# Patient Record
Sex: Male | Born: 1965 | Race: White | Hispanic: No | Marital: Married | Smoking: Current every day smoker
Health system: Southern US, Community
[De-identification: ages and names within clinical notes are randomized; demographics above are authoritative.]

## PROBLEM LIST (undated history)

## (undated) DIAGNOSIS — G4730 Sleep apnea, unspecified: Secondary | ICD-10-CM

## (undated) DIAGNOSIS — E119 Type 2 diabetes mellitus without complications: Secondary | ICD-10-CM

## (undated) DIAGNOSIS — I639 Cerebral infarction, unspecified: Secondary | ICD-10-CM

## (undated) DIAGNOSIS — N289 Disorder of kidney and ureter, unspecified: Secondary | ICD-10-CM

## (undated) DIAGNOSIS — I10 Essential (primary) hypertension: Secondary | ICD-10-CM

---

## 2017-12-24 ENCOUNTER — Encounter (HOSPITAL_BASED_OUTPATIENT_CLINIC_OR_DEPARTMENT_OTHER): Payer: Self-pay | Admitting: *Deleted

## 2017-12-24 ENCOUNTER — Other Ambulatory Visit: Payer: Self-pay

## 2017-12-24 ENCOUNTER — Emergency Department (HOSPITAL_BASED_OUTPATIENT_CLINIC_OR_DEPARTMENT_OTHER): Payer: Self-pay

## 2017-12-24 ENCOUNTER — Emergency Department (HOSPITAL_BASED_OUTPATIENT_CLINIC_OR_DEPARTMENT_OTHER)
Admission: EM | Admit: 2017-12-24 | Discharge: 2017-12-24 | Disposition: A | Discharge status: Other Acute Inpatient Hospital | Payer: Self-pay | Attending: Emergency Medicine | Admitting: Emergency Medicine

## 2017-12-24 DIAGNOSIS — M6281 Muscle weakness (generalized): Secondary | ICD-10-CM | POA: Insufficient documentation

## 2017-12-24 DIAGNOSIS — Z794 Long term (current) use of insulin: Secondary | ICD-10-CM | POA: Insufficient documentation

## 2017-12-24 DIAGNOSIS — F17200 Nicotine dependence, unspecified, uncomplicated: Secondary | ICD-10-CM | POA: Insufficient documentation

## 2017-12-24 DIAGNOSIS — E119 Type 2 diabetes mellitus without complications: Secondary | ICD-10-CM | POA: Insufficient documentation

## 2017-12-24 DIAGNOSIS — R200 Anesthesia of skin: Secondary | ICD-10-CM | POA: Insufficient documentation

## 2017-12-24 DIAGNOSIS — I10 Essential (primary) hypertension: Secondary | ICD-10-CM | POA: Insufficient documentation

## 2017-12-24 HISTORY — DX: Cerebral infarction, unspecified: I63.9

## 2017-12-24 HISTORY — DX: Type 2 diabetes mellitus without complications: E11.9

## 2017-12-24 HISTORY — DX: Essential (primary) hypertension: I10

## 2017-12-24 HISTORY — DX: Sleep apnea, unspecified: G47.30

## 2017-12-24 HISTORY — DX: Disorder of kidney and ureter, unspecified: N28.9

## 2017-12-24 LAB — I-STAT VENOUS BLOOD GAS, ED
Bicarbonate: 24.9 mmol/L (ref 20.0–28.0)
O2 Saturation: 96 %
Patient temperature: 99.3
TCO2: 26 mmol/L (ref 22–32)
pCO2, Ven: 41.5 mmHg — ABNORMAL LOW (ref 44.0–60.0)
pH, Ven: 7.388 (ref 7.250–7.430)
pO2, Ven: 83 mmHg — ABNORMAL HIGH (ref 32.0–45.0)

## 2017-12-24 LAB — CBC WITH DIFFERENTIAL/PLATELET
Basophils Absolute: 0.1 10*3/uL (ref 0.0–0.1)
Basophils Relative: 1 %
Eosinophils Absolute: 0.2 10*3/uL (ref 0.0–0.7)
Eosinophils Relative: 2 %
HCT: 38.2 % — ABNORMAL LOW (ref 39.0–52.0)
Hemoglobin: 13.1 g/dL (ref 13.0–17.0)
Lymphocytes Relative: 32 %
Lymphs Abs: 2.6 10*3/uL (ref 0.7–4.0)
MCH: 29.4 pg (ref 26.0–34.0)
MCHC: 34.3 g/dL (ref 30.0–36.0)
MCV: 85.7 fL (ref 78.0–100.0)
Monocytes Absolute: 0.4 10*3/uL (ref 0.1–1.0)
Monocytes Relative: 4 %
Neutro Abs: 5 10*3/uL (ref 1.7–7.7)
Neutrophils Relative %: 61 %
Platelets: 360 10*3/uL (ref 150–400)
RBC: 4.46 MIL/uL (ref 4.22–5.81)
RDW: 14.5 % (ref 11.5–15.5)
WBC: 8.2 10*3/uL (ref 4.0–10.5)

## 2017-12-24 LAB — RAPID URINE DRUG SCREEN, HOSP PERFORMED
Amphetamines: NOT DETECTED
Barbiturates: NOT DETECTED
Benzodiazepines: NOT DETECTED
Cocaine: NOT DETECTED
Opiates: NOT DETECTED
Tetrahydrocannabinol: NOT DETECTED

## 2017-12-24 LAB — I-STAT CG4 LACTIC ACID, ED: Lactic Acid, Venous: 2.68 mmol/L (ref 0.5–1.9)

## 2017-12-24 LAB — COMPREHENSIVE METABOLIC PANEL
ALT: 18 U/L (ref 17–63)
AST: 26 U/L (ref 15–41)
Albumin: 3.4 g/dL — ABNORMAL LOW (ref 3.5–5.0)
Alkaline Phosphatase: 60 U/L (ref 38–126)
Anion gap: 11 (ref 5–15)
BUN: 26 mg/dL — ABNORMAL HIGH (ref 6–20)
CO2: 23 mmol/L (ref 22–32)
Calcium: 9 mg/dL (ref 8.9–10.3)
Chloride: 101 mmol/L (ref 101–111)
Creatinine, Ser: 1.84 mg/dL — ABNORMAL HIGH (ref 0.61–1.24)
GFR calc Af Amer: 48 mL/min — ABNORMAL LOW (ref >60)
GFR calc non Af Amer: 41 mL/min — ABNORMAL LOW (ref >60)
Glucose, Bld: 349 mg/dL — ABNORMAL HIGH (ref 65–99)
Potassium: 4 mmol/L (ref 3.5–5.1)
Sodium: 135 mmol/L (ref 135–145)
Total Bilirubin: 0.2 mg/dL — ABNORMAL LOW (ref 0.3–1.2)
Total Protein: 6.6 g/dL (ref 6.5–8.1)

## 2017-12-24 LAB — CBG MONITORING, ED: Glucose-Capillary: 333 mg/dL — ABNORMAL HIGH (ref 65–99)

## 2017-12-24 LAB — URINALYSIS, ROUTINE W REFLEX MICROSCOPIC
Bilirubin Urine: NEGATIVE
Glucose, UA: >=500 mg/dL — AB
Ketones, ur: NEGATIVE mg/dL
Leukocytes, UA: NEGATIVE
Nitrite: NEGATIVE
Protein, ur: >300 mg/dL — AB
Specific Gravity, Urine: >1.03 — ABNORMAL HIGH (ref 1.005–1.030)
pH: 6 (ref 5.0–8.0)

## 2017-12-24 LAB — URINALYSIS, MICROSCOPIC (REFLEX)

## 2017-12-24 MED ORDER — SODIUM CHLORIDE 0.9 % IV BOLUS (SEPSIS)
1000.0000 mL | Freq: Once | INTRAVENOUS | Status: AC
  Administered 2017-12-24: 1000 mL via INTRAVENOUS

## 2017-12-24 MED ORDER — ASPIRIN 81 MG PO CHEW
324.0000 mg | CHEWABLE_TABLET | Freq: Once | ORAL | Status: DC
  Filled 2017-12-24: qty 4

## 2017-12-24 NOTE — ED Notes (Signed)
Pt on monitor 

## 2017-12-24 NOTE — ED Provider Notes (Signed)
[facility] EMERGENCY DEPARTMENT Provider Note   CSN: [id number] Arrival date & time: 12/24/17  1422     History   Chief Complaint Chief Complaint  Patient presents with  . Numbness    HPI Joe Wyatt is a 52 y.o. male.  Patient is a 52 year old male with a history of hypertension, diabetes and prior strokes who presents with left leg weakness.  He states that yesterday morning at about 10:30 AM he noticed that his left leg was weak and he was having to drag it.  He also had some associated numbness.  He did not have any other extremity weakness.  No headache.  No vision changes.  No speech deficits.  He states that it remained weak throughout the day.  He drove home from work and took a nap late yesterday afternoon when he woke up from nap his symptoms had resolved.  He states that his symptoms remained resolved until today at 1:00 PM he noticed a recurrence of the left leg weakness.  It felt similar to yesterday although it was not as severe.  He states his numbness and weakness was still present on arrival to the emergency department although now it is completely resolved.  He denies any arm involvement.  No speech deficits.  No vision changes.  No recent head injuries.  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  He was given TPA and his symptoms resolved.  He is also had a prior stroke resulting in right-sided weakness and he has some mild chronic right-sided weakness related to that.  He denies any other recent illnesses.  No fevers.  No cough or cold symptoms.  No chest pain or shortness of breath.  He has noted that his blood sugars been elevated today.  He felt like his blood sugar was elevated last night and took an increased dose of his insulin.      Past Medical History:  Diagnosis Date  . Diabetes mellitus without complication (HCC)   . Hypertension   . Renal disorder   . Sleep apnea   . Stroke [facility])      There are no active problems to display for this patient.   History reviewed. No pertinent surgical history.     Home Medications    Prior to Admission medications   Not on File    Family History No family history on file.  Social History Social History   Tobacco Use  . Smoking status: Current Every Day Smoker  . Smokeless tobacco: Never Used  Substance Use Topics  . Alcohol use: Yes  . Drug use: No     Allergies   Patient has no known allergies.   Review of Systems Review of Systems  Constitutional: Negative for chills, diaphoresis, fatigue and fever.  HENT: Negative for congestion, rhinorrhea and sneezing.   Eyes: Negative.   Respiratory: Negative for cough, chest tightness and shortness of breath.   Cardiovascular: Negative for chest pain and leg swelling.  Gastrointestinal: Negative for abdominal pain, blood in stool, diarrhea, nausea and vomiting.  Genitourinary: Negative for difficulty urinating, flank pain, frequency and hematuria.  Musculoskeletal: Negative for arthralgias and back pain.  Skin: Negative for rash.  Neurological: Positive for weakness and numbness. Negative for dizziness, speech difficulty and headaches.     Physical Exam Updated Vital Signs BP (!) 169/96   Pulse 87   Temp 98.6 F (37 C)   Resp 18  Ht 5\' 8"  (1.727 m)   Wt 88.5 kg (195 lb)   SpO2 97%   BMI 29.65 kg/m   Physical Exam  Constitutional: He is oriented to person, place, and time. He appears well-developed and well-nourished.  HENT:  Head: Normocephalic and atraumatic.  Eyes: Pupils are equal, round, and reactive to light.  Neck: Normal range of motion. Neck supple.  Cardiovascular: Normal rate, regular rhythm and normal heart sounds.  Pulmonary/Chest: Effort normal and breath sounds normal. No respiratory distress. He has no wheezes. He has no rales. He exhibits no tenderness.  Abdominal: Soft. Bowel sounds are normal. There is no tenderness. There is no  rebound and no guarding.  Musculoskeletal: Normal range of motion. He exhibits no edema.  Lymphadenopathy:    He has no cervical adenopathy.  Neurological: He is alert and oriented to person, place, and time.  Motor 5/5 all extremities Sensation grossly intact to LT all extremities Finger to Nose intact, no pronator drift CN II-XII grossly intact    Skin: Skin is warm and dry. No rash noted.  Psychiatric: He has a normal mood and affect.     ED Treatments / Results  Labs (all labs ordered are listed, but only abnormal results are displayed) Labs Reviewed  COMPREHENSIVE METABOLIC PANEL - Abnormal; Notable for the following components:      Result Value   Glucose, Bld 349 (*)    BUN 26 (*)    Creatinine, Ser 1.84 (*)    Albumin 3.4 (*)    Total Bilirubin 0.2 (*)    GFR calc non Af Amer 41 (*)    GFR calc Af Amer 48 (*)    All other components within normal limits  CBC WITH DIFFERENTIAL/PLATELET - Abnormal; Notable for the following components:   HCT 38.2 (*)    All other components within normal limits  CBG MONITORING, ED - Abnormal; Notable for the following components:   Glucose-Capillary 333 (*)    All other components within normal limits  I-STAT CG4 LACTIC ACID, ED - Abnormal; Notable for the following components:   Lactic Acid, Venous 2.68 (*)    All other components within normal limits  I-STAT VENOUS BLOOD GAS, ED - Abnormal; Notable for the following components:   pCO2, Ven 41.5 (*)    pO2, Ven 83.0 (*)    All other components within normal limits  URINALYSIS, ROUTINE W REFLEX MICROSCOPIC  BASIC METABOLIC PANEL  ETHANOL  PROTIME-INR  APTT  DIFFERENTIAL  TROPONIN I  RAPID URINE DRUG SCREEN, HOSP PERFORMED  I-STAT CG4 LACTIC ACID, ED    EKG  EKG Interpretation  Date/Time:  Monday December 24 2017 15:53:00 EDT Ventricular Rate:  92 PR Interval:    QRS Duration: 103 QT Interval:  368 QTC Calculation: 456 R Axis:   140 Text Interpretation:  Sinus rhythm  Right axis deviation Borderline repolarization abnormality No old tracing to compare Confirmed by Rolan BuccoBelfi, Donovon Micheletti [phone]([id number]) on 12/24/2017 4:30:49 PM       Radiology Ct Head Wo Contrast  Result Date: 12/24/2017 CLINICAL DATA:  Numbness and heaviness of the left leg, intermittent yesterday and today. EXAM: CT HEAD WITHOUT CONTRAST TECHNIQUE: Contiguous axial images were obtained from the base of the skull through the vertex without intravenous contrast. COMPARISON:  None. FINDINGS: Brain: The brainstem and cerebellum are normal. Question indistinct low density in the left lateral thalamus/posterior limb internal capsule. This could represent an ischemic insult. Cerebral hemispheres otherwise appear within normal limits. No mass lesion, hemorrhage,  hydrocephalus or extra-axial collection. Vascular: Mild atherosclerotic calcification in the carotid siphon regions. Skull: Normal Sinuses/Orbits: Clear/normal Other: None IMPRESSION: No definite abnormality. Question asymmetric low-density in the left lateral thalamus/posterior limb internal capsule that could represent ischemic change, age indeterminate. Electronically Signed   By: Paulina Fusi M.D.   On: 12/24/2017 16:20    Procedures Procedures (including critical care time)  Medications Ordered in ED Medications  aspirin chewable tablet 324 mg (not administered)  sodium chloride 0.9 % bolus 1,000 mL (1,000 mLs Intravenous New Bag/Given 12/24/17 1509)     Initial Impression / Assessment and Plan / ED Course  I have reviewed the triage vital signs and the nursing notes.  Pertinent labs & imaging results that were available during my care of the patient were reviewed by me and considered in my medical decision making (see chart for details).     Patient is a 52 year old male with a history of prior strokes who presents with left side numbness and weakness.  His symptoms have subsequently resolved.  Currently he does not have any neurologic deficits.   However given his current symptoms and associated with his risk factors, I feel that he does need to be admitted for a TIA evaluation.  He is requesting to be admitted to [facility].  I spoke with Dr. Aileen Pilot who is with the neurology team there who is accepted the patient for transfer to the emergency department.  Patient's head CT was evaluated and does show possible new ischemic insult.  He does not meet criteria for any current intervention given his lack of current symptoms.  He was given a dose of aspirin in the emergency department.  He passed a swallowing screen.  His labs are reviewed.  He had triage orders placed which include a lactic acid which is elevated.  He does not have any other symptoms that would be concerning for sepsis.  Final Clinical Impressions(s) / ED Diagnoses   Final diagnoses:  Left sided numbness    ED Discharge Orders    None       Rolan Bucco, MD 12/24/17 1734

## 2017-12-24 NOTE — ED Notes (Signed)
Pt. Has gone to CT Scan

## 2017-12-24 NOTE — ED Triage Notes (Signed)
States he can walk to triage. C.o numbness and heaviness to his left lower leg on and off since yesterday. States he did get relief with insulin yesterday.

## 2018-07-16 IMAGING — CT CT HEAD W/O CM
3 series · 14 of 46 positions shown, 16 images · non-contrast
Comparison: None.

CLINICAL DATA: Numbness and heaviness of the left leg, intermittent
yesterday and today.

EXAM:
CT HEAD WITHOUT CONTRAST
TECHNIQUE: Contiguous axial images were obtained from the base of the skull
through the vertex without intravenous contrast.

[Series 2: head wo · axial · 0.39mm/px · z∈[+1173,+1293]mm · 8 of 29 slices shown, 10 images]
[im 3/29  brain]
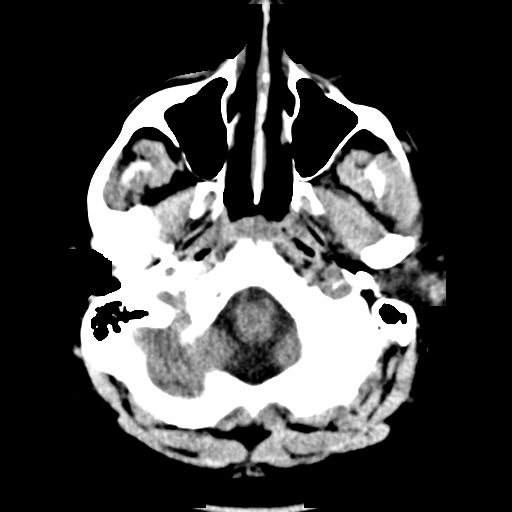
[im 3/29  bone]
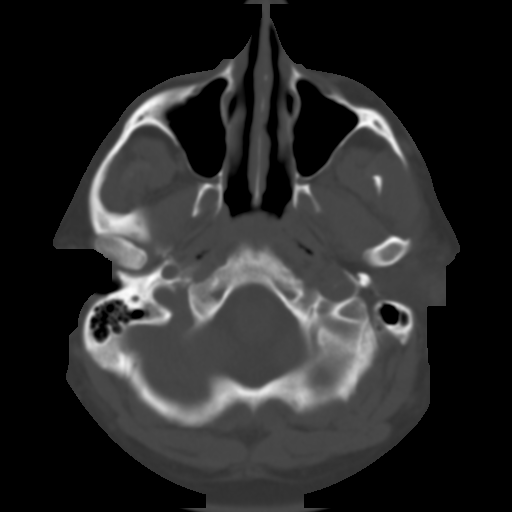
[im 7/29  brain]
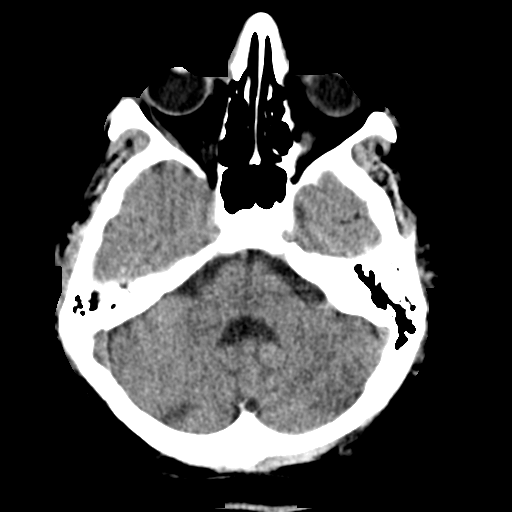
[im 10/29  brain]
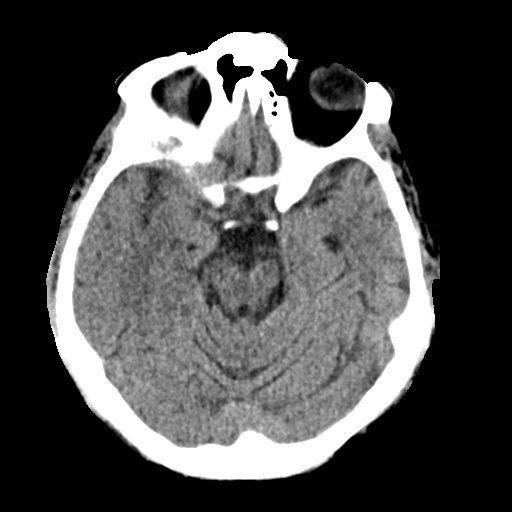
[im 13/29  brain]
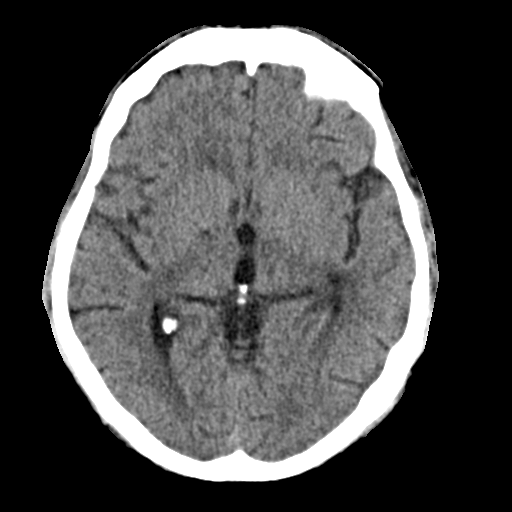
[im 17/29  brain]
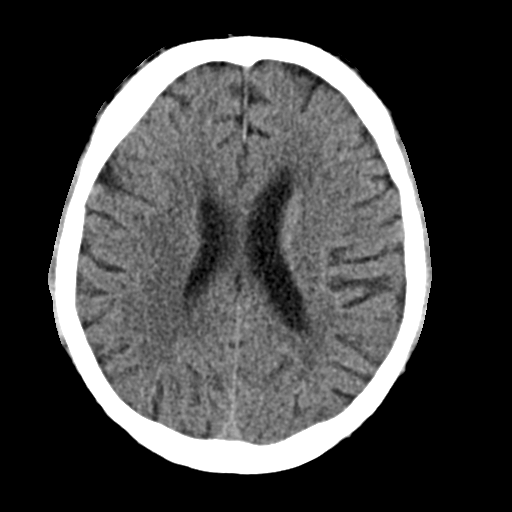
[im 17/29  bone]
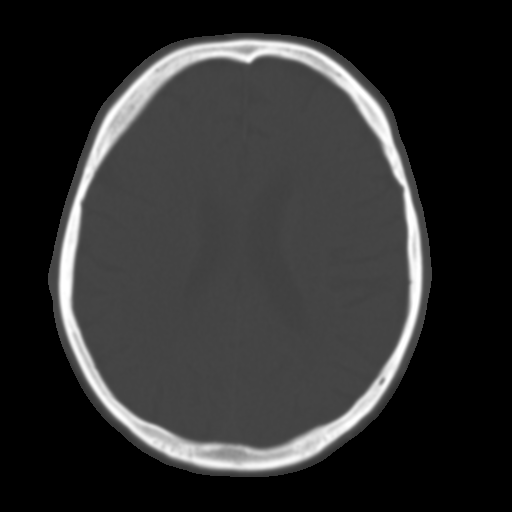
[im 20/29  brain]
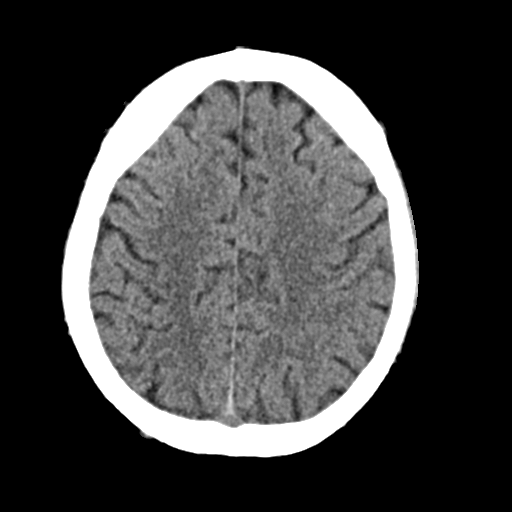
[im 23/29  brain]
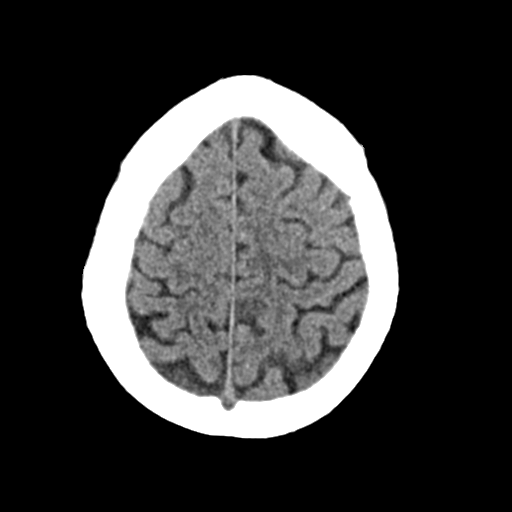
[im 27/29  brain]
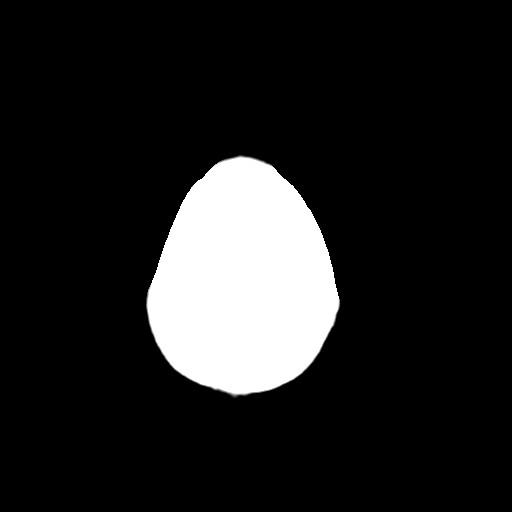

[Series 4: coronal soft · coronal · 0.30mm/px · 3 of 67 slices shown]
[im 23/67  brain]
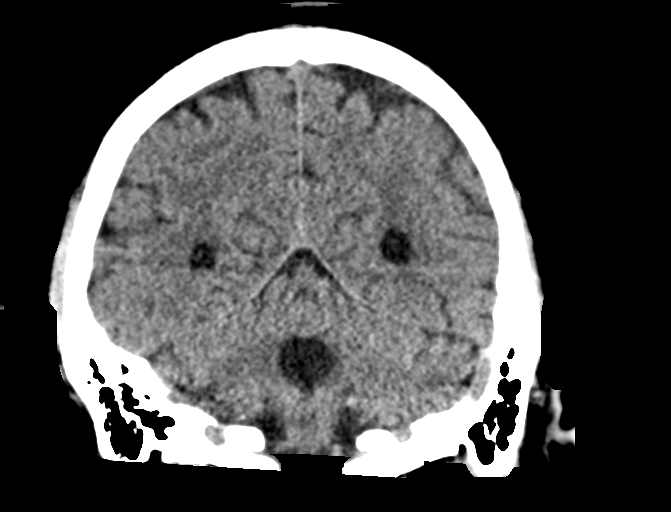
[im 30/67  brain]
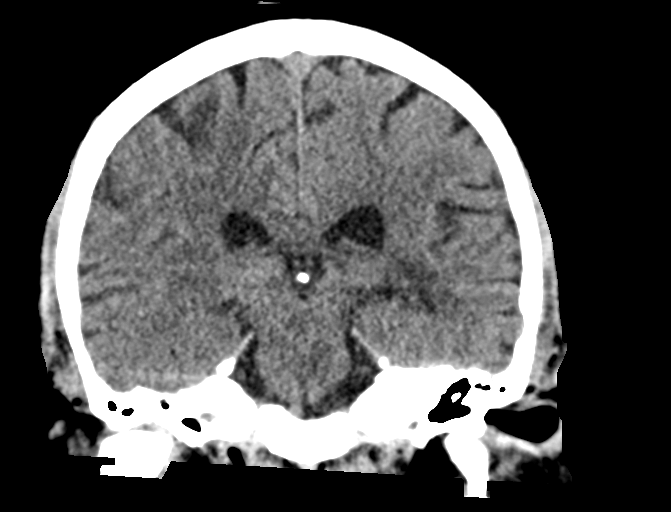
[im 37/67  brain]
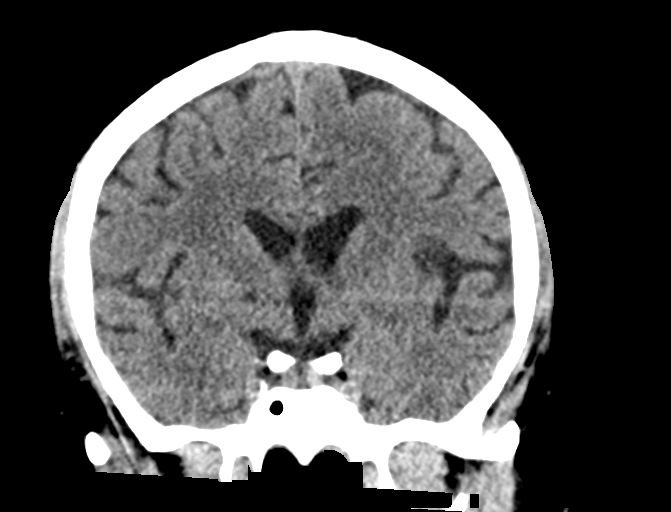

[Series 5: sag soft · sagittal · 0.28mm/px · 3 of 60 slices shown]
[im 20/60  brain]
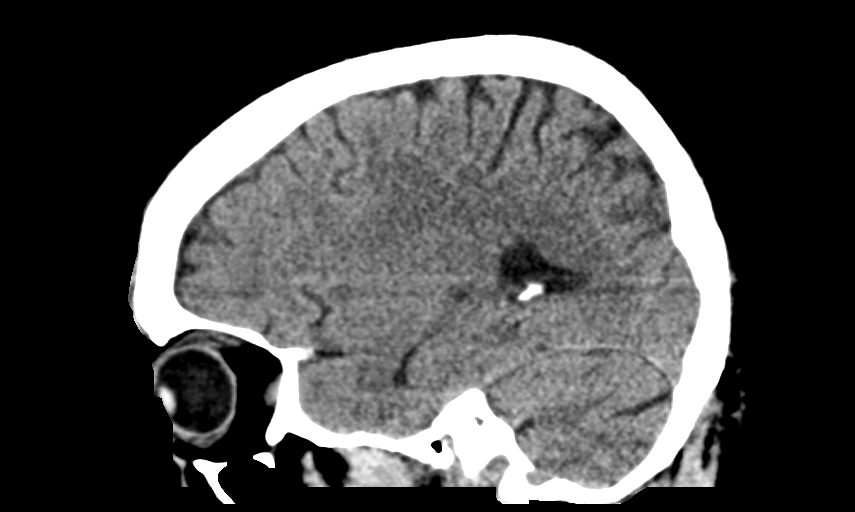
[im 30/60  brain]
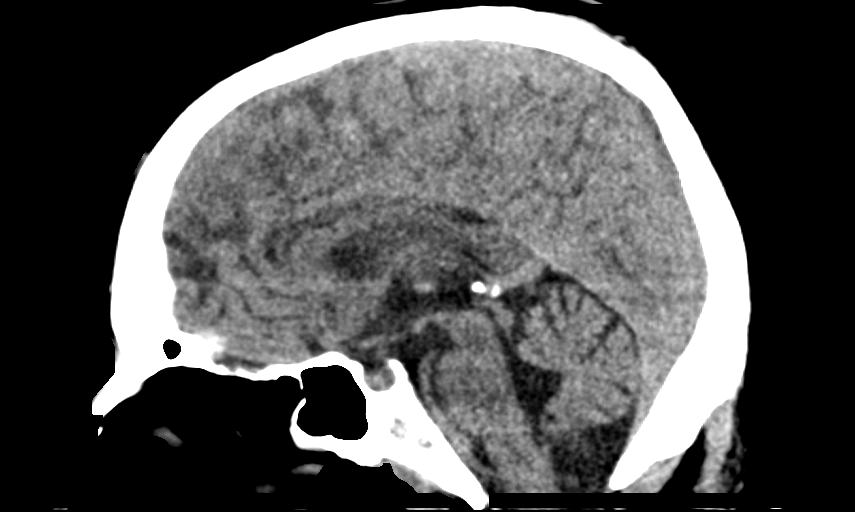
[im 40/60  brain]
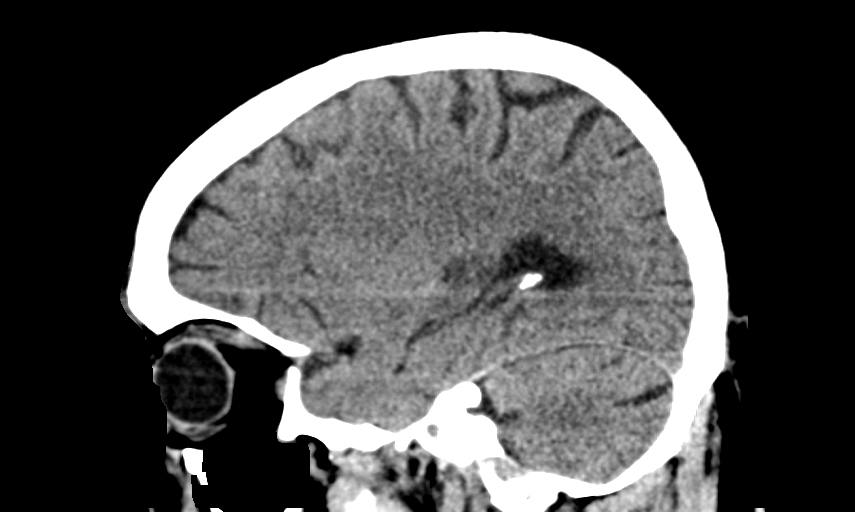

[14 of 46 positions shown; findings below may reference images not displayed]

FINDINGS: Brain: The brainstem and cerebellum are normal. Question indistinct
low density in the left lateral thalamus/posterior limb internal
capsule. This could represent an ischemic insult. Cerebral
hemispheres otherwise appear within normal limits. No mass lesion,
hemorrhage, hydrocephalus or extra-axial collection.

Vascular: Mild atherosclerotic calcification in the carotid siphon
regions.

Skull: Normal

Sinuses/Orbits: Clear/normal

Other: None
IMPRESSION: No definite abnormality. Question asymmetric low-density in the left
lateral thalamus/posterior limb internal capsule that could
represent ischemic change, age indeterminate.

## 2019-11-17 DEATH — deceased
